# Patient Record
Sex: Female | Born: 1980 | Race: Black or African American | Hispanic: No | Marital: Single | Smoking: Current every day smoker
Health system: Southern US, Community
[De-identification: ages and names within clinical notes are randomized; demographics above are authoritative.]

## PROBLEM LIST (undated history)

## (undated) DIAGNOSIS — B20 Human immunodeficiency virus [HIV] disease: Secondary | ICD-10-CM

## (undated) DIAGNOSIS — E119 Type 2 diabetes mellitus without complications: Secondary | ICD-10-CM

---

## 2018-10-17 ENCOUNTER — Emergency Department
Admission: EM | Admit: 2018-10-17 | Discharge: 2018-10-17 | Disposition: A | Discharge status: Home / Self Care | Payer: Medicaid Other | Attending: Emergency Medicine | Admitting: Emergency Medicine

## 2018-10-17 ENCOUNTER — Emergency Department: Payer: Medicaid Other

## 2018-10-17 ENCOUNTER — Other Ambulatory Visit: Payer: Self-pay

## 2018-10-17 DIAGNOSIS — F17200 Nicotine dependence, unspecified, uncomplicated: Secondary | ICD-10-CM | POA: Insufficient documentation

## 2018-10-17 DIAGNOSIS — K529 Noninfective gastroenteritis and colitis, unspecified: Secondary | ICD-10-CM

## 2018-10-17 DIAGNOSIS — R1011 Right upper quadrant pain: Secondary | ICD-10-CM

## 2018-10-17 DIAGNOSIS — R109 Unspecified abdominal pain: Secondary | ICD-10-CM

## 2018-10-17 DIAGNOSIS — E119 Type 2 diabetes mellitus without complications: Secondary | ICD-10-CM | POA: Insufficient documentation

## 2018-10-17 HISTORY — DX: Type 2 diabetes mellitus without complications: E11.9

## 2018-10-17 HISTORY — DX: Human immunodeficiency virus (HIV) disease: B20

## 2018-10-17 LAB — URINALYSIS, COMPLETE (UACMP) WITH MICROSCOPIC
Bilirubin Urine: NEGATIVE
Glucose, UA: 50 mg/dL — AB
Ketones, ur: NEGATIVE mg/dL
Leukocytes,Ua: NEGATIVE
Nitrite: NEGATIVE
Protein, ur: 100 mg/dL — AB
Specific Gravity, Urine: 1.011 (ref 1.005–1.030)
pH: 6 (ref 5.0–8.0)

## 2018-10-17 LAB — CBC
HCT: 43.4 % (ref 36.0–46.0)
Hemoglobin: 14.4 g/dL (ref 12.0–15.0)
MCH: 29.4 pg (ref 26.0–34.0)
MCHC: 33.2 g/dL (ref 30.0–36.0)
MCV: 88.6 fL (ref 80.0–100.0)
PLATELETS: 247 10*3/uL (ref 150–400)
RBC: 4.9 MIL/uL (ref 3.87–5.11)
RDW: 13.5 % (ref 11.5–15.5)
WBC: 7.9 10*3/uL (ref 4.0–10.5)
nRBC: 0 % (ref 0.0–0.2)

## 2018-10-17 LAB — COMPREHENSIVE METABOLIC PANEL
ALK PHOS: 70 U/L (ref 38–126)
ALT: 22 U/L (ref 0–44)
AST: 19 U/L (ref 15–41)
Albumin: 4 g/dL (ref 3.5–5.0)
Anion gap: 8 (ref 5–15)
BUN: 8 mg/dL (ref 6–20)
CALCIUM: 8.7 mg/dL — AB (ref 8.9–10.3)
CO2: 28 mmol/L (ref 22–32)
Chloride: 102 mmol/L (ref 98–111)
Creatinine, Ser: 0.54 mg/dL (ref 0.44–1.00)
GFR calc Af Amer: >60 mL/min (ref >60)
GFR calc non Af Amer: >60 mL/min (ref >60)
Glucose, Bld: 252 mg/dL — ABNORMAL HIGH (ref 70–99)
Potassium: 4.2 mmol/L (ref 3.5–5.1)
Sodium: 138 mmol/L (ref 135–145)
Total Bilirubin: 0.4 mg/dL (ref 0.3–1.2)
Total Protein: 8 g/dL (ref 6.5–8.1)

## 2018-10-17 LAB — POCT PREGNANCY, URINE: Preg Test, Ur: NEGATIVE

## 2018-10-17 LAB — LIPASE, BLOOD: Lipase: 41 U/L (ref 11–51)

## 2018-10-17 MED ORDER — FAMOTIDINE 20 MG PO TABS: 20.0000 mg | ORAL_TABLET | Freq: Two times a day (BID) | ORAL | 1 refills | Status: DC

## 2018-10-17 MED ORDER — SODIUM CHLORIDE 0.9 % IV BOLUS
1000.0000 mL | Freq: Once | INTRAVENOUS | Status: AC
  Administered 2018-10-17: 1000 mL via INTRAVENOUS

## 2018-10-17 MED ORDER — IOPAMIDOL (ISOVUE-300) INJECTION 61%: 30.0000 mL | Freq: Once | INTRAVENOUS | Status: DC

## 2018-10-17 MED ORDER — IOHEXOL 300 MG/ML  SOLN
125.0000 mL | Freq: Once | INTRAMUSCULAR | Status: AC | PRN
  Administered 2018-10-17: 125 mL via INTRAVENOUS

## 2018-10-17 MED ORDER — FENTANYL CITRATE (PF) 100 MCG/2ML IJ SOLN
50.0000 ug | Freq: Once | INTRAMUSCULAR | Status: AC
  Administered 2018-10-17: 50 ug via INTRAVENOUS
  Filled 2018-10-17: qty 2

## 2018-10-17 MED ORDER — ONDANSETRON HCL 4 MG PO TABS: 4.0000 mg | ORAL_TABLET | Freq: Three times a day (TID) | ORAL | 0 refills | Status: DC | PRN

## 2018-10-17 NOTE — Discharge Instructions (Addendum)
He had fever, increased pain, vomiting that is not stopped with the medication we gave you, or any other new or worrisome symptoms return to the emergency room.

## 2018-10-17 NOTE — ED Triage Notes (Signed)
Pt states generalized abd with bilat flank pain x 3 days with N&V. States "diarrhea at home." no fever. A&O, ambulatory.

## 2018-10-17 NOTE — ED Triage Notes (Signed)
Says stomach pain and bloating for 3 days that radiates around bilateral flank areas

## 2018-10-17 NOTE — ED Provider Notes (Addendum)
[facility] Emergency Department Provider Note  ____________________________________________   I have reviewed the triage vital signs and the nursing notes. Where available I have reviewed prior notes and, if possible and indicated, outside hospital notes.    HISTORY  Chief Complaint Abdominal Pain    HPI Anna Conley is a 38 y.o. female  Who unfortunately suffers from morbid obesity and HIV which is well controlled on HIV medications, she states her CD4 count was over 700 and her viral load is undetectable last it was checked which was "recently".  Patient also has a history of diabetes mellitus and is compliant with her diabetes medication.  She states she is had abdominal discomfort, mostly in her flanks and epigastric region, as well as a bloating feeling and vomiting and some loose stools.  This is been going on for 3 days.  She states she has had no fever.  She is not had this before.  There are no records available in our system or in care everywhere as she is from [place]. Sharp, periumbilical mostly radiates "everywhere".  Is a better nothing makes it worse no other alleviating aggravating symptoms no other prior treatment.   Past Medical History:  Diagnosis Date  . Diabetes mellitus without complication (HCC)   . HIV (human immunodeficiency virus infection) (HCC)     There are no active problems to display for this patient.   History reviewed. No pertinent surgical history.  Prior to Admission medications   Not on File    Allergies Patient has no known allergies.  History reviewed. No pertinent family history.  Social History Social History   Tobacco Use  . Smoking status: Current Every Day Smoker  Substance Use Topics  . Alcohol use: Not Currently    Frequency: Never  . Drug use: Not on file    Review of Systems Constitutional: No fever/chills Eyes: No visual changes. ENT: No sore throat. No stiff neck no neck  pain Cardiovascular: Denies chest pain. Respiratory: Denies shortness of breath. Gastrointestinal: The HPI Genitourinary: Negative for dysuria. Musculoskeletal: Negative lower extremity swelling Skin: Negative for rash. Neurological: Negative for severe headaches, focal weakness or numbness.   ____________________________________________   PHYSICAL EXAM:  VITAL SIGNS: ED Triage Vitals  Enc Vitals Group     BP 10/17/18 1513 135/75     Pulse Rate 10/17/18 1513 75     Resp 10/17/18 1513 18     Temp 10/17/18 1513 98.2 F (36.8 C)     Temp Source 10/17/18 1513 Oral     SpO2 10/17/18 1513 96 %     Weight 10/17/18 1515 290 lb (131.5 kg)     Height 10/17/18 1515  (1.6 m)     Head Circumference --      Peak Flow --      Pain Score 10/17/18 1514 9     Pain Loc --      Pain Edu? --      Excl. in GC? --     Constitutional: Alert and oriented. Well appearing and in no acute distress. Eyes: Conjunctivae are normal Head: Atraumatic HEENT: No congestion/rhinnorhea. Mucous membranes are moist.  Oropharynx non-erythematous Neck:   Nontender with no meningismus, no masses, no stridor Cardiovascular: Normal rate, regular rhythm. Grossly normal heart sounds.  Good peripheral circulation. Respiratory: Normal respiratory effort.  No retractions. Lungs CTAB. Abdominal: Soft significant morbid obesity limits exam, epigastric region shows is mild tenderness. Back:  There is no focal tenderness or step off.  there is no midline tenderness there are no lesions noted. there is again limited by obesity slight bilateral CVA tenderness Musculoskeletal: No lower extremity tenderness, no upper extremity tenderness. No joint effusions, no DVT signs strong distal pulses no edema Neurologic:  Normal speech and language. No gross focal neurologic deficits are appreciated.  Skin:  Skin is warm, dry and intact. No rash noted. Psychiatric: Mood and affect are normal. Speech and behavior are  normal.  ____________________________________________   LABS (all labs ordered are listed, but only abnormal results are displayed)  Labs Reviewed  COMPREHENSIVE METABOLIC PANEL - Abnormal; Notable for the following components:      Result Value   Glucose, Bld 252 (*)    Calcium 8.7 (*)    All other components within normal limits  URINALYSIS, COMPLETE (UACMP) WITH MICROSCOPIC - Abnormal; Notable for the following components:   Color, Urine YELLOW (*)    APPearance CLEAR (*)    Glucose, UA 50 (*)    Hgb urine dipstick SMALL (*)    Protein, ur 100 (*)    Bacteria, UA RARE (*)    All other components within normal limits  LIPASE, BLOOD  CBC  POC URINE PREG, ED  POCT PREGNANCY, URINE    Pertinent labs  results that were available during my care of the patient were reviewed by me and considered in my medical decision making (see chart for details). ____________________________________________  EKG  I personally interpreted any EKGs ordered by me or triage  ____________________________________________  RADIOLOGY  Pertinent labs & imaging results that were available during my care of the patient were reviewed by me and considered in my medical decision making (see chart for details). If possible, patient and/or family made aware of any abnormal findings.  No results found. ____________________________________________    PROCEDURES  Procedure(s) performed: None  Procedures  Critical Care performed: None  ____________________________________________   INITIAL IMPRESSION / ASSESSMENT AND PLAN / ED COURSE  Pertinent labs & imaging results that were available during my care of the patient were reviewed by me and considered in my medical decision making (see chart for details).  Appearing woman at this time although baseline has multiple medical problems presents today with vomiting diarrhea and epigastric abdominal discomfort which radiates "everywhere".  Very limited  exam because of very significant morbid obesity.  We will obtain imaging.  She does seem to have some degree of right more than left upper quadrant abdominal discomfort so we will also obtain ultrasound.  Patient is in no acute medical distress, she is requesting pain medications which we will give.  Blood work is quite reassuring.  ----------------------------------------- 7:46 PM on 10/17/2018 -----------------------------------------  Patient with nausea vomiting and diarrhea, given her history of HIV, and her complaints of abdominal pain and difficulty with exam, I did do extensive imaging and blood work which is all very reassuring.  I have offered her a pelvic exam which she refused which is certainly her choice, she understands risk-benefit alternatives to refusal.  She is just visiting from [place] she has plans to go back soon, I will refer her to local ID in case she elects to stay, return precautions follow-up given and understood.    ____________________________________________   FINAL CLINICAL IMPRESSION(S) / ED DIAGNOSES  Final diagnoses:  RUQ pain      This chart was dictated using voice recognition software.  Despite best efforts to proofread,  errors can occur which can change meaning.      Ileana Roup  A, MD 10/17/18 1741    Jeanmarie Plant, MD 10/17/18 [phone]

## 2018-10-17 NOTE — ED Notes (Signed)
Pt states '"I've tried Ibuprofen, naproxen" w/o relief of symptoms. Pt states feeling N/V/D this morning.

## 2018-10-17 NOTE — ED Notes (Signed)
Patient transported to Ultrasound 

## 2019-03-30 ENCOUNTER — Ambulatory Visit: Payer: Medicaid Other | Admitting: Nurse Practitioner

## 2019-03-30 ENCOUNTER — Telehealth: Payer: Self-pay

## 2019-03-30 NOTE — Telephone Encounter (Signed)
[facility] RN. Needs to r/s missed STI appt per DIS. Aileen Fass, RN

## 2019-04-10 ENCOUNTER — Ambulatory Visit: Payer: Medicaid Other

## 2019-10-29 ENCOUNTER — Encounter: Payer: Self-pay | Admitting: Emergency Medicine

## 2019-10-29 ENCOUNTER — Other Ambulatory Visit: Payer: Self-pay

## 2019-10-29 ENCOUNTER — Emergency Department
Admission: EM | Admit: 2019-10-29 | Discharge: 2019-10-29 | Disposition: A | Discharge status: Home / Self Care | Payer: Self-pay | Attending: Emergency Medicine | Admitting: Emergency Medicine

## 2019-10-29 ENCOUNTER — Emergency Department: Payer: Self-pay

## 2019-10-29 DIAGNOSIS — B20 Human immunodeficiency virus [HIV] disease: Secondary | ICD-10-CM | POA: Insufficient documentation

## 2019-10-29 DIAGNOSIS — M79605 Pain in left leg: Secondary | ICD-10-CM | POA: Insufficient documentation

## 2019-10-29 DIAGNOSIS — R739 Hyperglycemia, unspecified: Secondary | ICD-10-CM

## 2019-10-29 DIAGNOSIS — F17210 Nicotine dependence, cigarettes, uncomplicated: Secondary | ICD-10-CM | POA: Insufficient documentation

## 2019-10-29 DIAGNOSIS — Z79899 Other long term (current) drug therapy: Secondary | ICD-10-CM | POA: Insufficient documentation

## 2019-10-29 DIAGNOSIS — M5441 Lumbago with sciatica, right side: Secondary | ICD-10-CM | POA: Insufficient documentation

## 2019-10-29 DIAGNOSIS — M79604 Pain in right leg: Secondary | ICD-10-CM | POA: Insufficient documentation

## 2019-10-29 DIAGNOSIS — M7120 Synovial cyst of popliteal space [Baker], unspecified knee: Secondary | ICD-10-CM | POA: Insufficient documentation

## 2019-10-29 DIAGNOSIS — M5442 Lumbago with sciatica, left side: Secondary | ICD-10-CM | POA: Insufficient documentation

## 2019-10-29 DIAGNOSIS — E119 Type 2 diabetes mellitus without complications: Secondary | ICD-10-CM | POA: Insufficient documentation

## 2019-10-29 LAB — COMPREHENSIVE METABOLIC PANEL
ALT: 16 U/L (ref 0–44)
AST: 20 U/L (ref 15–41)
Albumin: 3.9 g/dL (ref 3.5–5.0)
Alkaline Phosphatase: 88 U/L (ref 38–126)
Anion gap: 7 (ref 5–15)
BUN: 10 mg/dL (ref 6–20)
CO2: 28 mmol/L (ref 22–32)
Calcium: 8.5 mg/dL — ABNORMAL LOW (ref 8.9–10.3)
Chloride: 98 mmol/L (ref 98–111)
Creatinine, Ser: 0.61 mg/dL (ref 0.44–1.00)
GFR calc Af Amer: >60 mL/min (ref >60)
GFR calc non Af Amer: >60 mL/min (ref >60)
Glucose, Bld: 301 mg/dL — ABNORMAL HIGH (ref 70–99)
Potassium: 5 mmol/L (ref 3.5–5.1)
Sodium: 133 mmol/L — ABNORMAL LOW (ref 135–145)
Total Bilirubin: 0.8 mg/dL (ref 0.3–1.2)
Total Protein: 8.9 g/dL — ABNORMAL HIGH (ref 6.5–8.1)

## 2019-10-29 LAB — URINALYSIS, COMPLETE (UACMP) WITH MICROSCOPIC
Bacteria, UA: NONE SEEN
Bilirubin Urine: NEGATIVE
Glucose, UA: >=500 mg/dL — AB
Hgb urine dipstick: NEGATIVE
Ketones, ur: NEGATIVE mg/dL
Leukocytes,Ua: NEGATIVE
Nitrite: NEGATIVE
Protein, ur: NEGATIVE mg/dL
Specific Gravity, Urine: 1.028 (ref 1.005–1.030)
pH: 7 (ref 5.0–8.0)

## 2019-10-29 LAB — CBC
HCT: 40.9 % (ref 36.0–46.0)
Hemoglobin: 13.3 g/dL (ref 12.0–15.0)
MCH: 28.5 pg (ref 26.0–34.0)
MCHC: 32.5 g/dL (ref 30.0–36.0)
MCV: 87.6 fL (ref 80.0–100.0)
Platelets: 232 10*3/uL (ref 150–400)
RBC: 4.67 MIL/uL (ref 3.87–5.11)
RDW: 13.9 % (ref 11.5–15.5)
WBC: 5.5 10*3/uL (ref 4.0–10.5)
nRBC: 0 % (ref 0.0–0.2)

## 2019-10-29 LAB — POCT PREGNANCY, URINE: Preg Test, Ur: NEGATIVE

## 2019-10-29 MED ORDER — KETOROLAC TROMETHAMINE 30 MG/ML IJ SOLN
30.0000 mg | Freq: Once | INTRAMUSCULAR | Status: AC
  Administered 2019-10-29: 30 mg via INTRAMUSCULAR
  Filled 2019-10-29: qty 1

## 2019-10-29 MED ORDER — MELOXICAM 15 MG PO TABS: 15.0000 mg | ORAL_TABLET | Freq: Every day | ORAL | 0 refills | Status: AC

## 2019-10-29 MED ORDER — MELOXICAM 15 MG PO TABS: 15.0000 mg | ORAL_TABLET | Freq: Every day | ORAL | 0 refills | Status: DC

## 2019-10-29 MED ORDER — CYCLOBENZAPRINE HCL 5 MG PO TABS: ORAL_TABLET | ORAL | 0 refills | Status: DC

## 2019-10-29 MED ORDER — OXYCODONE-ACETAMINOPHEN 5-325 MG PO TABS
1.0000 | ORAL_TABLET | Freq: Once | ORAL | Status: AC
  Administered 2019-10-29: 1 via ORAL
  Filled 2019-10-29: qty 1

## 2019-10-29 MED ORDER — CYCLOBENZAPRINE HCL 5 MG PO TABS: ORAL_TABLET | ORAL | 0 refills | Status: AC

## 2019-10-29 NOTE — ED Notes (Signed)
See triage note  Presents with lower back pain which started 3-4 days ago   States pain is moving into both legs  Ambulates slowly at present  Denies any injury
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
## 2019-10-29 NOTE — ED Triage Notes (Signed)
Pt in via POV, reports lower back pain with radiation down bilateral legs, states, "I cant sit too long, I cant stand up too long, I cant sleep."  Denies any recent injury.  Reports taking OTC medication without any relief.  NAD noted at this time.

## 2020-02-07 ENCOUNTER — Telehealth: Payer: Self-pay | Admitting: General Practice

## 2020-02-07 NOTE — Telephone Encounter (Signed)
Individual now has medicaid and is out of state. No longer eligible to become a pt at [facility].

## 2021-08-10 IMAGING — CR DG LUMBAR SPINE 2-3V
3 series · 3 of 3 positions shown · non-contrast
Comparison: None.

CLINICAL DATA: Back pain

EXAM:
LUMBAR SPINE - 2-3 VIEW

[l-spine ap]
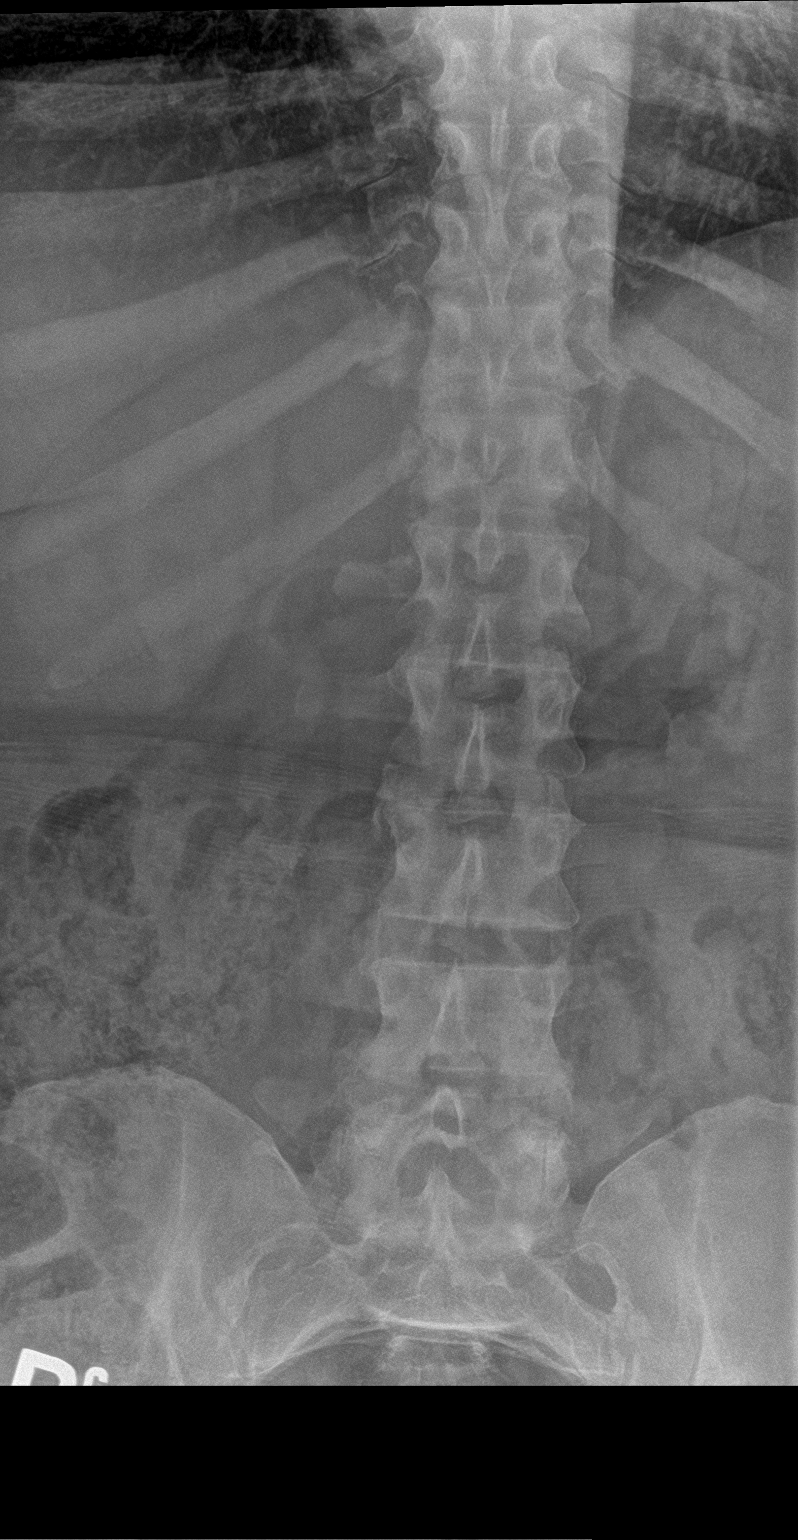

[l-spine lat]
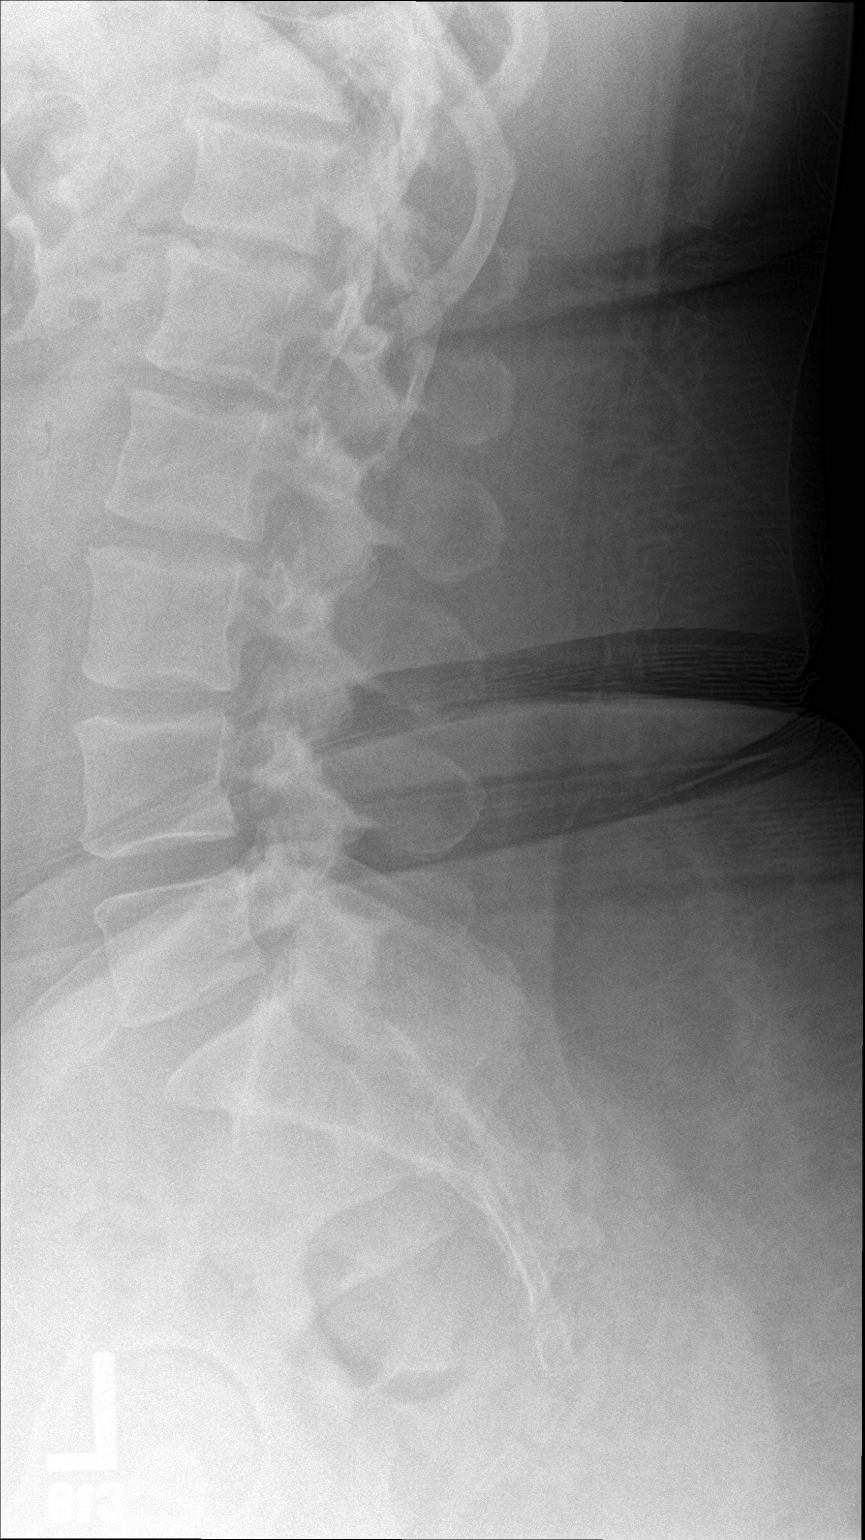

[l-spine spot]
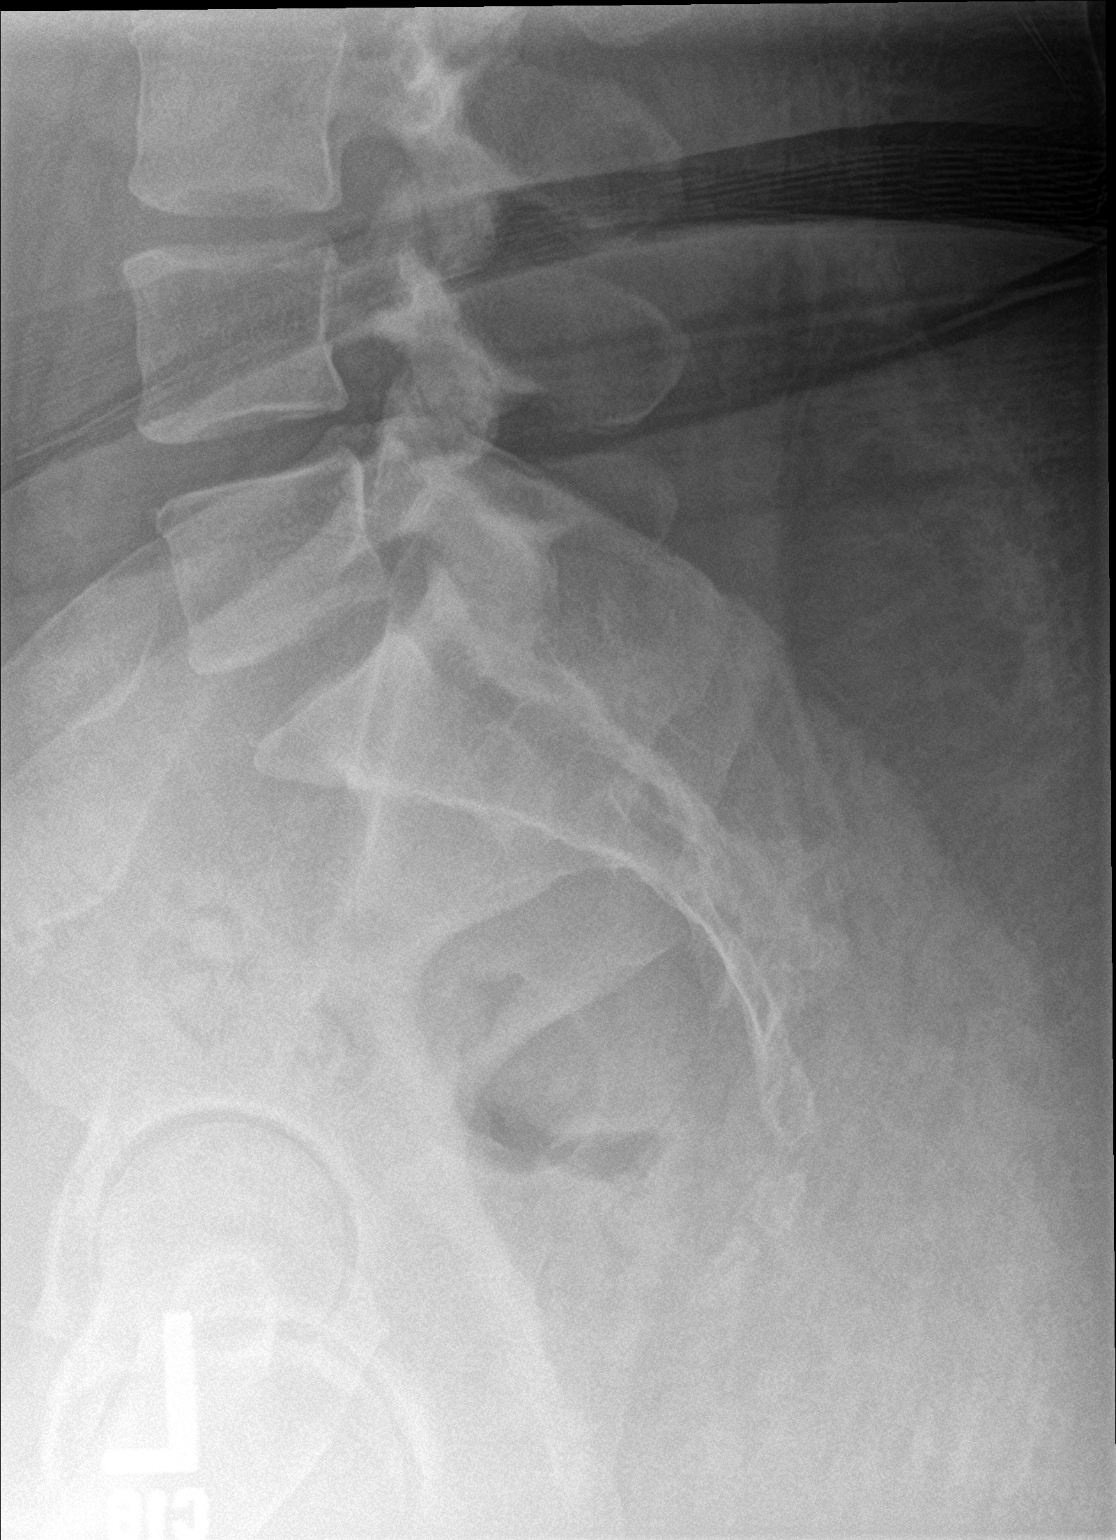

[3 of 3 positions shown; findings below may reference images not displayed]

FINDINGS: Vertebral body heights and alignment are maintained. No substantial
disc space narrowing or facet hypertrophy. Probable congenital
narrowing of the spinal canal.
IMPRESSION: No significant degenerative changes. Probable congenital narrowing
of the spinal canal.

## 2021-08-10 IMAGING — US US EXTREM LOW VENOUS
1 series · 13 of 24 positions shown · non-contrast
Comparison: None.

CLINICAL DATA: Leg pain for 4 days

EXAM:
BILATERAL LOWER EXTREMITY VENOUS DOPPLER ULTRASOUND
TECHNIQUE: Gray-scale sonography with compression, as well as color and duplex
ultrasound, were performed to evaluate the deep venous system(s)
from the level of the common femoral vein through the popliteal and
proximal calf veins.

[Series 1: us extrem low venous · 0.08mm/px · 13 of 62 slices shown]
[im 1/62]
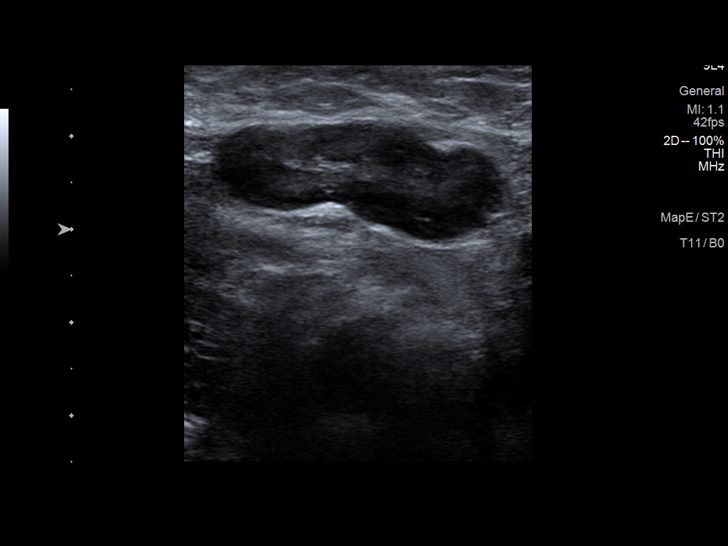
[im 6/62]
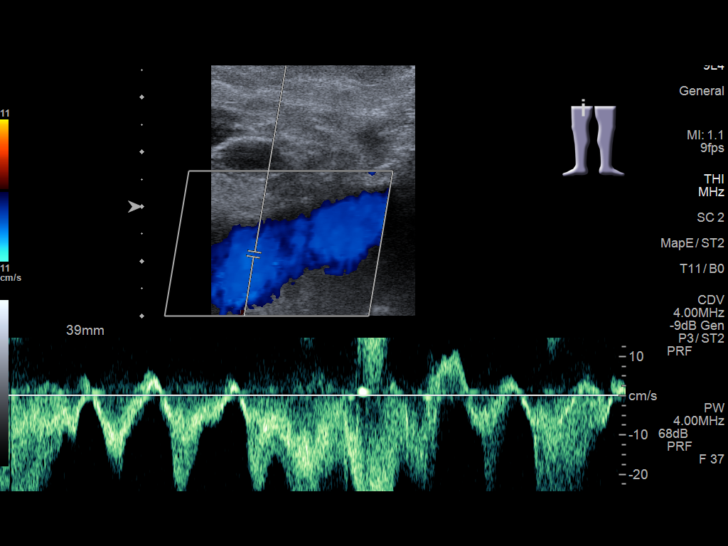
[im 11/62]
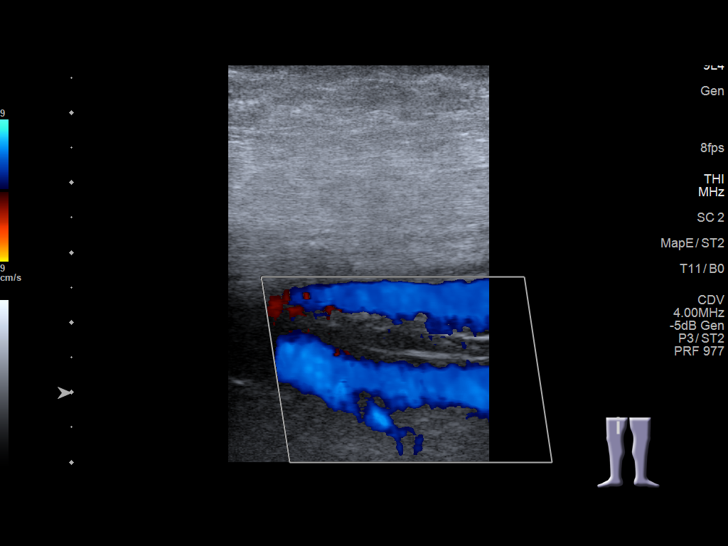
[im 16/62]
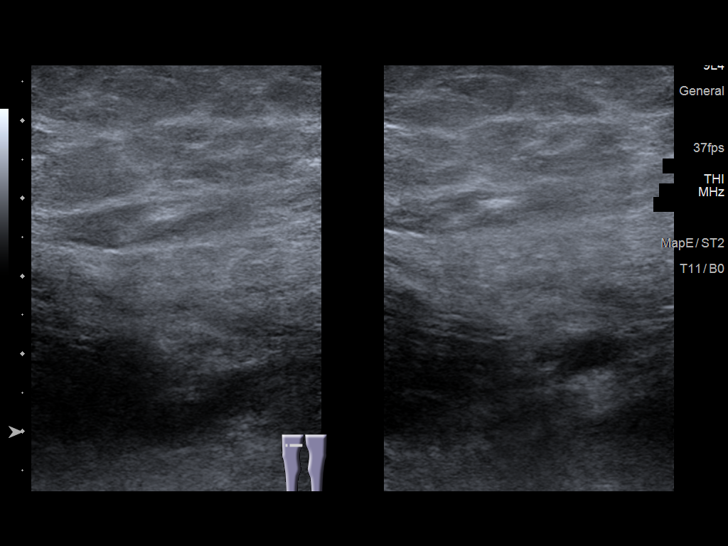
[im 22/62]
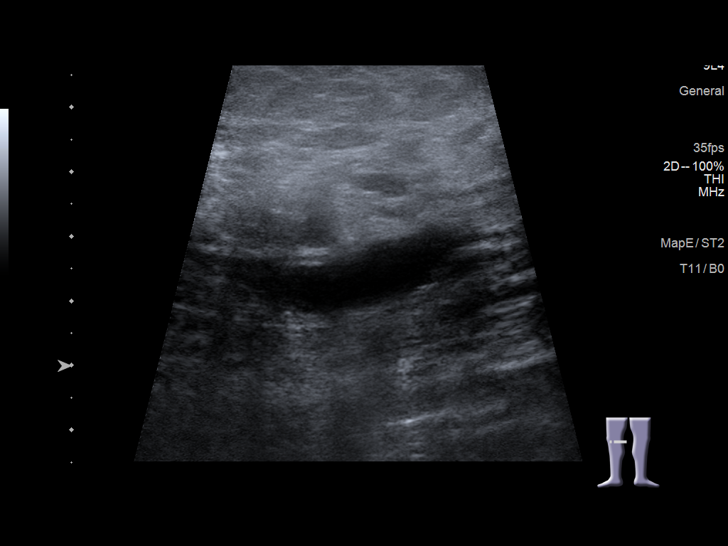
[im 27/62]
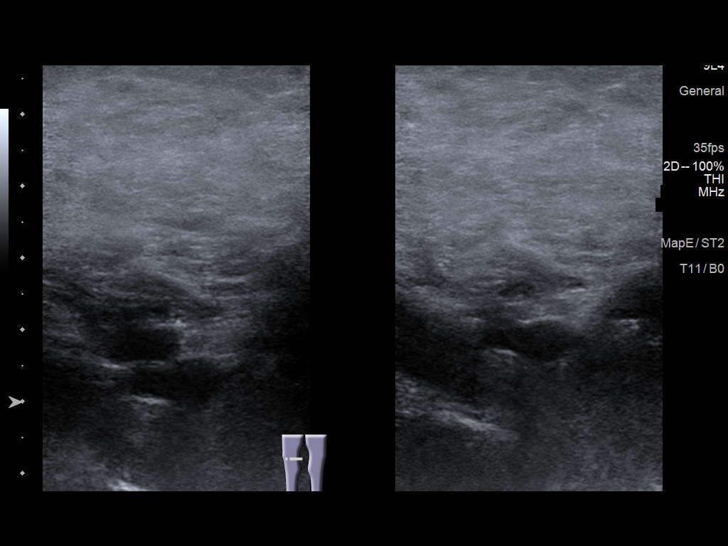
[im 32/62]
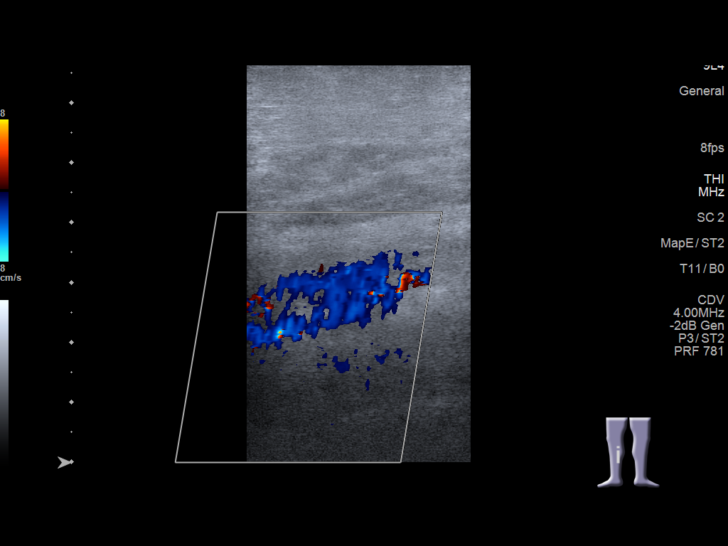
[im 35/62]
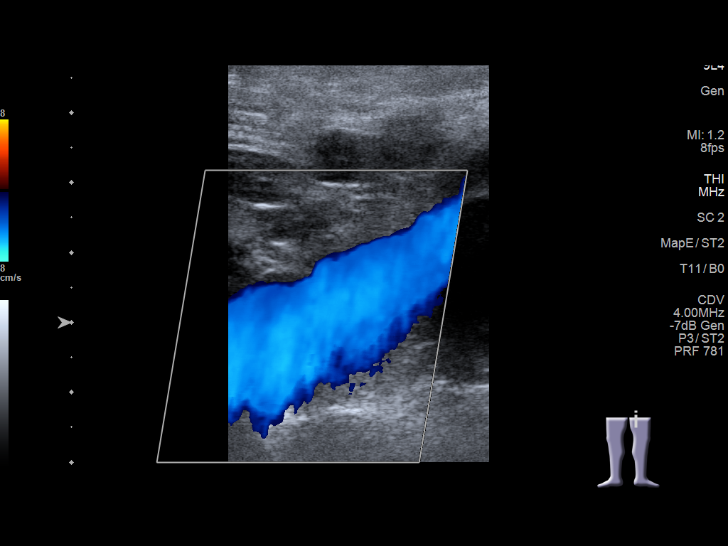
[im 40/62]
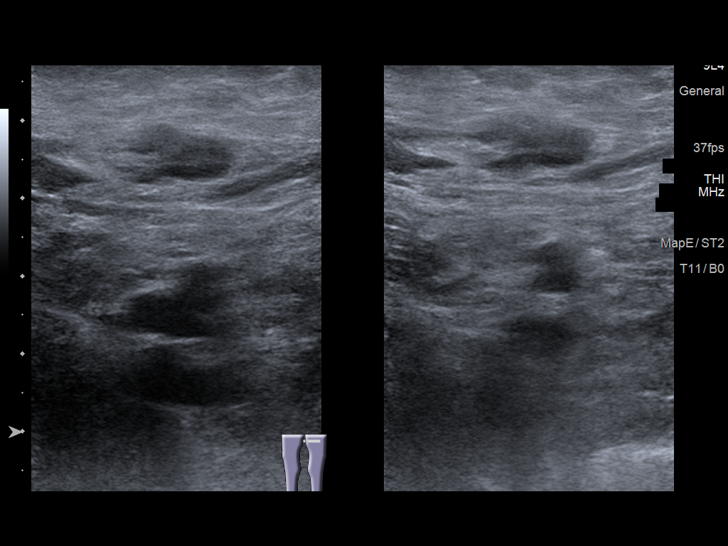
[im 46/62]
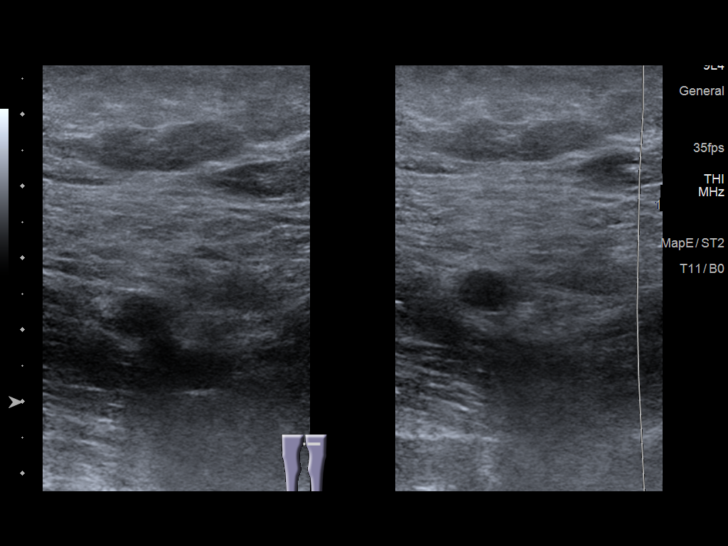
[im 51/62]
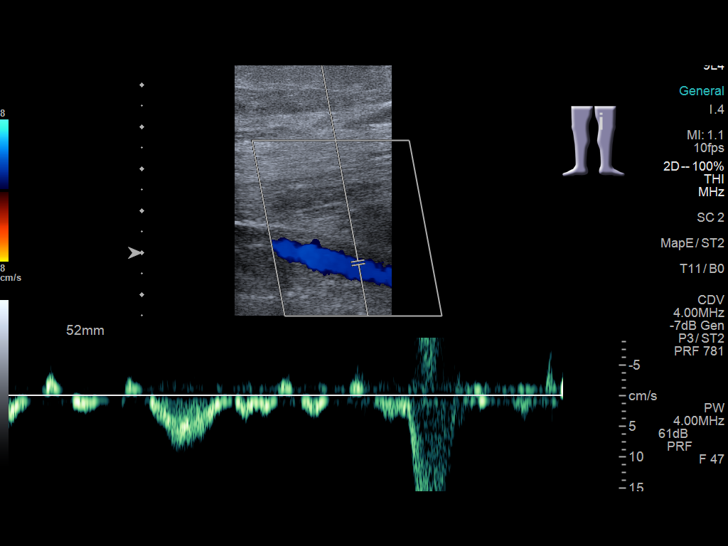
[im 56/62]
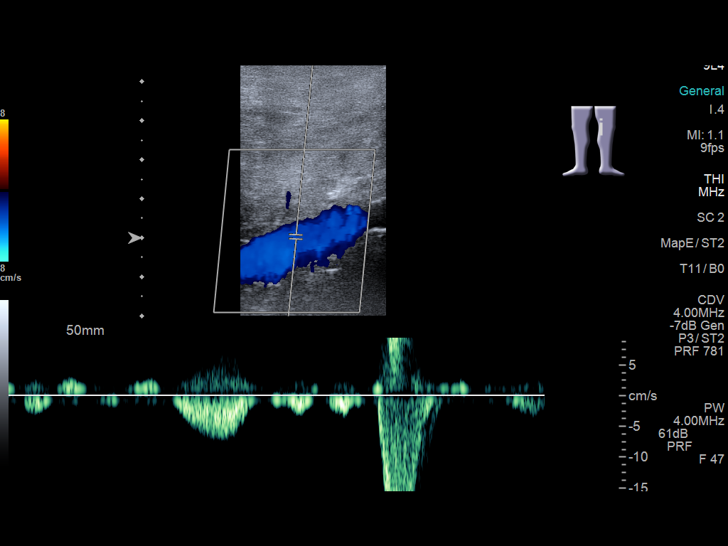
[im 62/62]
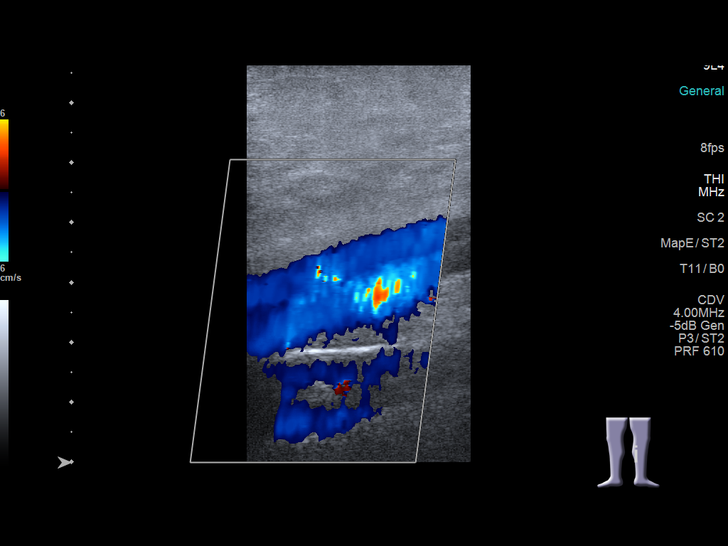

[13 of 24 positions shown; findings below may reference images not displayed]

FINDINGS: VENOUS

Normal compressibility of the common femoral, superficial femoral,
and popliteal veins, as well as the visualized calf veins.
Visualized portions of profunda femoral vein and great saphenous
vein unremarkable. No filling defects to suggest DVT on grayscale or
color Doppler imaging. Doppler waveforms show normal direction of
venous flow, normal respiratory phasicity and response to
augmentation.

Limited views of the contralateral common femoral vein are
unremarkable.

OTHER

Fluid collections in the bilateral popliteal fossae. Enlarged
bilateral inguinal lymph nodes.

Limitations: none
IMPRESSION: 1. No femoropopliteal DVT nor evidence of DVT within the visualized
calf veins.

If clinical symptoms are inconsistent or if there are persistent or
worsening symptoms, further imaging (possibly involving the iliac
veins) may be warranted.

2. Fluid collections in the bilateral popliteal fossae, consistent
in appearance and location with Baker's cysts.

3.  Enlarged, nonspecific bilateral inguinal lymph nodes.
